# Patient Record
Sex: Female | Born: 1975 | Race: White | Hispanic: No | Marital: Married | Smoking: Never smoker
Health system: Southern US, Community
[De-identification: ages and names within clinical notes are randomized; demographics above are authoritative.]

## PROBLEM LIST (undated history)

## (undated) DIAGNOSIS — J45909 Unspecified asthma, uncomplicated: Secondary | ICD-10-CM

## (undated) DIAGNOSIS — Q796 Ehlers-Danlos syndrome, unspecified: Secondary | ICD-10-CM

## (undated) DIAGNOSIS — E7521 Fabry (-Anderson) disease: Secondary | ICD-10-CM

## (undated) DIAGNOSIS — K3184 Gastroparesis: Secondary | ICD-10-CM

## (undated) DIAGNOSIS — M797 Fibromyalgia: Secondary | ICD-10-CM

---

## 2018-08-23 ENCOUNTER — Encounter (HOSPITAL_COMMUNITY): Payer: Self-pay | Admitting: Emergency Medicine

## 2018-08-23 ENCOUNTER — Other Ambulatory Visit: Payer: Self-pay

## 2018-08-23 ENCOUNTER — Emergency Department (HOSPITAL_COMMUNITY): Payer: BLUE CROSS/BLUE SHIELD

## 2018-08-23 ENCOUNTER — Emergency Department (HOSPITAL_COMMUNITY)
Admission: EM | Admit: 2018-08-23 | Discharge: 2018-08-23 | Disposition: A | Discharge status: Home / Self Care | Payer: BLUE CROSS/BLUE SHIELD | Attending: Emergency Medicine | Admitting: Emergency Medicine

## 2018-08-23 DIAGNOSIS — R0602 Shortness of breath: Secondary | ICD-10-CM | POA: Diagnosis present

## 2018-08-23 DIAGNOSIS — J40 Bronchitis, not specified as acute or chronic: Secondary | ICD-10-CM | POA: Diagnosis not present

## 2018-08-23 DIAGNOSIS — J4541 Moderate persistent asthma with (acute) exacerbation: Secondary | ICD-10-CM | POA: Insufficient documentation

## 2018-08-23 HISTORY — DX: Gastroparesis: K31.84

## 2018-08-23 HISTORY — DX: Unspecified asthma, uncomplicated: J45.909

## 2018-08-23 HISTORY — DX: Ehlers-Danlos syndrome, unspecified: Q79.60

## 2018-08-23 HISTORY — DX: Fibromyalgia: M79.7

## 2018-08-23 HISTORY — DX: Fabry (-anderson) disease: E75.21

## 2018-08-23 LAB — CBC
HEMATOCRIT: 34 % — AB (ref 36.0–46.0)
HEMOGLOBIN: 11.3 g/dL — AB (ref 12.0–15.0)
MCH: 29.4 pg (ref 26.0–34.0)
MCHC: 33.2 g/dL (ref 30.0–36.0)
MCV: 88.5 fL (ref 78.0–100.0)
Platelets: 323 10*3/uL (ref 150–400)
RBC: 3.84 MIL/uL — AB (ref 3.87–5.11)
RDW: 14.6 % (ref 11.5–15.5)
WBC: 6.7 10*3/uL (ref 4.0–10.5)

## 2018-08-23 LAB — I-STAT BETA HCG BLOOD, ED (MC, WL, AP ONLY)

## 2018-08-23 LAB — BASIC METABOLIC PANEL
ANION GAP: 13 (ref 5–15)
BUN: 12 mg/dL (ref 6–20)
CHLORIDE: 108 mmol/L (ref 98–111)
CO2: 21 mmol/L — ABNORMAL LOW (ref 22–32)
Calcium: 9.6 mg/dL (ref 8.9–10.3)
Creatinine, Ser: 0.68 mg/dL (ref 0.44–1.00)
Glucose, Bld: 115 mg/dL — ABNORMAL HIGH (ref 70–99)
POTASSIUM: 3.8 mmol/L (ref 3.5–5.1)
SODIUM: 142 mmol/L (ref 135–145)

## 2018-08-23 LAB — I-STAT TROPONIN, ED: Troponin i, poc: 0 ng/mL (ref 0.00–0.08)

## 2018-08-23 MED ORDER — IPRATROPIUM-ALBUTEROL 0.5-2.5 (3) MG/3ML IN SOLN
3.0000 mL | Freq: Once | RESPIRATORY_TRACT | Status: AC
  Administered 2018-08-23: 3 mL via RESPIRATORY_TRACT
  Filled 2018-08-23: qty 3

## 2018-08-23 MED ORDER — AZITHROMYCIN 250 MG PO TABS: 250.0000 mg | ORAL_TABLET | Freq: Every day | ORAL | 0 refills | Status: AC

## 2018-08-23 MED ORDER — ALBUTEROL SULFATE (2.5 MG/3ML) 0.083% IN NEBU
5.0000 mg | INHALATION_SOLUTION | Freq: Once | RESPIRATORY_TRACT | Status: AC
  Administered 2018-08-23: 5 mg via RESPIRATORY_TRACT
  Filled 2018-08-23: qty 6

## 2018-08-23 MED ORDER — ALBUTEROL SULFATE HFA 108 (90 BASE) MCG/ACT IN AERS: 1.0000 | INHALATION_SPRAY | Freq: Four times a day (QID) | RESPIRATORY_TRACT | 0 refills | Status: AC | PRN

## 2018-08-23 MED ORDER — GI COCKTAIL ~~LOC~~
30.0000 mL | Freq: Once | ORAL | Status: AC
  Administered 2018-08-23: 30 mL via ORAL
  Filled 2018-08-23: qty 30

## 2018-08-23 MED ORDER — PREDNISONE 20 MG PO TABS
40.0000 mg | ORAL_TABLET | Freq: Once | ORAL | Status: AC
  Administered 2018-08-23: 40 mg via ORAL
  Filled 2018-08-23: qty 2

## 2018-08-23 MED ORDER — PREDNISONE 10 MG PO TABS: 40.0000 mg | ORAL_TABLET | Freq: Every day | ORAL | 0 refills | Status: AC

## 2018-08-23 MED ORDER — AZITHROMYCIN 250 MG PO TABS
500.0000 mg | ORAL_TABLET | Freq: Once | ORAL | Status: AC
  Administered 2018-08-23: 500 mg via ORAL
  Filled 2018-08-23: qty 2

## 2018-08-23 NOTE — Discharge Instructions (Signed)
We believe that your symptoms are caused today by an exacerbation of your asthma.  Please take the prescribed medications and any medications that you have at home.  Follow up with your doctor as recommended.  If you develop any new or worsening symptoms, including but not limited to fever, persistent vomiting, worsening shortness of breath, or other symptoms that concern you, please return to the Emergency Department immediately. ° ° °Asthma °Asthma is a recurring condition in which the airways tighten and narrow. Asthma can make it difficult to breathe. It can cause coughing, wheezing, and shortness of breath. Asthma episodes, also called asthma attacks, range from minor to life-threatening. Asthma cannot be cured, but medicines and lifestyle changes can help control it. °CAUSES °Asthma is believed to be caused by inherited (genetic) and environmental factors, but its exact cause is unknown. Asthma may be triggered by allergens, lung infections, or irritants in the air. Asthma triggers are different for each person. Common triggers include:  °Animal dander. °Dust mites. °Cockroaches. °Pollen from trees or grass. °Mold. °Smoke. °Air pollutants such as dust, household cleaners, hair sprays, aerosol sprays, paint fumes, strong chemicals, or strong odors. °Cold air, weather changes, and winds (which increase molds and pollens in the air). °Strong emotional expressions such as crying or laughing hard. °Stress. °Certain medicines (such as aspirin) or types of drugs (such as beta-blockers). °Sulfites in foods and drinks. Foods and drinks that may contain sulfites include dried fruit, potato chips, and sparkling grape juice. °Infections or inflammatory conditions such as the flu, a cold, or an inflammation of the nasal membranes (rhinitis). °Gastroesophageal reflux disease (GERD). °Exercise or strenuous activity. °SYMPTOMS °Symptoms may occur immediately after asthma is triggered or many hours later. Symptoms  include: °Wheezing. °Excessive nighttime or early morning coughing. °Frequent or severe coughing with a common cold. °Chest tightness. °Shortness of breath. °DIAGNOSIS  °The diagnosis of asthma is made by a review of your medical history and a physical exam. Tests may also be performed. These may include: °Lung function studies. These tests show how much air you breathe in and out. °Allergy tests. °Imaging tests such as X-rays. °TREATMENT  °Asthma cannot be cured, but it can usually be controlled. Treatment involves identifying and avoiding your asthma triggers. It also involves medicines. There are 2 classes of medicine used for asthma treatment:  °Controller medicines. These prevent asthma symptoms from occurring. They are usually taken every day. °Reliever or rescue medicines. These quickly relieve asthma symptoms. They are used as needed and provide short-term relief. °Your health care provider will help you create an asthma action plan. An asthma action plan is a written plan for managing and treating your asthma attacks. It includes a list of your asthma triggers and how they may be avoided. It also includes information on when medicines should be taken and when their dosage should be changed. An action plan may also involve the use of a device called a peak flow meter. A peak flow meter measures how well the lungs are working. It helps you monitor your condition. °HOME CARE INSTRUCTIONS  °Take medicines only as directed by your health care provider. Speak with your health care provider if you have questions about how or when to take the medicines. °Use a peak flow meter as directed by your health care provider. Record and keep track of readings. °Understand and use the action plan to help minimize or stop an asthma attack without needing to seek medical care. °Control your home environment in the following   ways to help prevent asthma attacks: °Do not smoke. Avoid being exposed to secondhand smoke. °Change  your heating and air conditioning filter regularly. °Limit your use of fireplaces and wood stoves. °Get rid of pests (such as roaches and mice) and their droppings. °Throw away plants if you see mold on them. °Clean your floors and dust regularly. Use unscented cleaning products. °Try to have someone else vacuum for you regularly. Stay out of rooms while they are being vacuumed and for a short while afterward. If you vacuum, use a dust mask from a hardware store, a double-layered or microfilter vacuum cleaner bag, or a vacuum cleaner with a HEPA filter. °Replace carpet with wood, tile, or vinyl flooring. Carpet can trap dander and dust. °Use allergy-proof pillows, mattress covers, and box spring covers. °Wash bed sheets and blankets every week in hot water and dry them in a dryer. °Use blankets that are made of polyester or cotton. °Clean bathrooms and kitchens with bleach. If possible, have someone repaint the walls in these rooms with mold-resistant paint. Keep out of the rooms that are being cleaned and painted. °Wash hands frequently. °SEEK MEDICAL CARE IF:  °You have wheezing, shortness of breath, or a cough even if taking medicine to prevent attacks. °The colored mucus you cough up (sputum) is thicker than usual. °Your sputum changes from clear or white to yellow, green, gray, or bloody. °You have any problems that may be related to the medicines you are taking (such as a rash, itching, swelling, or trouble breathing). °You are using a reliever medicine more than 2-3 times per week. °Your peak flow is still at 50-79% of your personal best after following your action plan for 1 hour. °You have a fever. °SEEK IMMEDIATE MEDICAL CARE IF:  °You seem to be getting worse and are unresponsive to treatment during an asthma attack. °You are short of breath even at rest. °You get short of breath when doing very little physical activity. °You have difficulty eating, drinking, or talking due to asthma symptoms. °You  develop chest pain. °You develop a fast heartbeat. °You have a bluish color to your lips or fingernails. °You are light-headed, dizzy, or faint. °Your peak flow is less than 50% of your personal best. °MAKE SURE YOU:  °Understand these instructions. °Will watch your condition. °Will get help right away if you are not doing well or get worse. °Document Released: 12/04/2005 Document Revised: 04/20/2014 Document Reviewed: 07/03/2013 °[employer]® Patient Information ©2015 [employer]. This information is not intended to replace advice given to you by your health care provider. Make sure you discuss any questions you have with your health care provider. ° °How to Use a Nebulizer °If you have asthma or other breathing problems, you might need to breathe in (inhale) medicine. This can be done with a nebulizer. A nebulizer is a device that turns liquid medicine into a mist that you can inhale.  °There are different kinds of nebulizers. Most are small. With some, you breathe in through a mouthpiece. With others, a mask fits over your nose and mouth. Most nebulizers must be connected to a small air compressor. Air is forced through tubing from the compressor to the nebulizer. The forced air changes the liquid into a fine spray. °RISKS AND COMPLICATIONS °The nebulizer must work properly for it to help your breathing. If the nebulizer does not produce mist, or if foam comes out, this indicates that the nebulizer is not working properly. Sometimes a filter can get clogged, or   there might be a problem with the air compressor. Check the instruction booklet that came with your nebulizer. It should tell you how to fix problems or where to call for help. You should have at least one extra nebulizer at home. That way, you will always have one when you need it.  °HOW TO PREPARE BEFORE USING THE NEBULIZER °Take these steps before using the nebulizer: °Check your medicine. Make sure it has not expired and is not damaged in any way.    °Wash your hands with soap and water.   °Put all the parts of your nebulizer on a sturdy, flat surface. Make sure the tubing connects the compressor and the nebulizer. °Measure the liquid medicine according to your health care provider's instructions. Pour it into the nebulizer. °Attach the mouthpiece or mask.   °Test the nebulizer by turning it on to make sure a spray is coming out. Then, turn it off.   °HOW TO USE THE NEBULIZER °Sit down and focus on staying relaxed.   °If your nebulizer has a mask, put it over your nose and mouth. If you use a mouthpiece, put it in your mouth. Press your lips firmly around the mouthpiece. °Turn on the nebulizer.   °Breathe out.   °Some nebulizers have a finger valve. If yours does, cover up the air hole so the air gets to the nebulizer. °Once the medicine begins to mist out, take slow, deep breaths. If there is a finger valve, release it at the end of your breath. °Continue taking slow, deep breaths until the nebulizer is empty.   °Be sure to stop the machine at any point if you start coughing or if the medicine foams or bubbles. °HOW TO CLEAN THE NEBULIZER  °The nebulizer and all its parts must be kept very clean. Follow the manufacturer's instructions for cleaning. For most nebulizers, you should follow these guidelines: °Wash the nebulizer after each use. Use warm water and soap. Rinse it well. Shake the nebulizer to remove extra water. Put it on a clean towel until it is completely dry. To make sure it is dry, put the nebulizer back together. Turn on the compressor for a few minutes. This will blow air through the nebulizer.   °Do not wash the tubing or the finger valve.   °Store the nebulizer in a dust-free place.   °Inspect the filter every week. Replace it any time it looks dirty.   °Sometimes the nebulizer will need a more complete cleaning. The instruction booklet should say how often you need to do this. °SEEK MEDICAL CARE IF:  °You continue to have difficulty  breathing.   °You have trouble using the nebulizer.   °Document Released: 11/22/2009 Document Revised: 04/20/2014 Document Reviewed: 05/26/2013 °[employer]® Patient Information ©2015 [employer]. This information is not intended to replace advice given to you by your health care provider. Make sure you discuss any questions you have with your health care provider. ° °How to Use an Inhaler °Proper inhaler technique is very important. Good technique ensures that the medicine reaches the lungs. Poor technique results in depositing the medicine on the tongue and back of the throat rather than in the airways. If you do not use the inhaler with good technique, the medicine will not help you. °STEPS TO FOLLOW IF USING AN INHALER WITHOUT AN EXTENSION TUBE °Remove the cap from the inhaler. °If you are using the inhaler for the first time, you will need to prime it. Shake the inhaler for   5 seconds and release four puffs into the air, away from your face. Ask your health care provider or pharmacist if you have questions about priming your inhaler. °Shake the inhaler for 5 seconds before each breath in (inhalation). °Position the inhaler so that the top of the canister faces up. °Put your index finger on the top of the medicine canister. Your thumb supports the bottom of the inhaler. °Open your mouth. °Either place the inhaler between your teeth and place your lips tightly around the mouthpiece, or hold the inhaler 1-2 inches away from your open mouth. If you are unsure of which technique to use, ask your health care provider. °Breathe out (exhale) normally and as completely as possible. °Press the canister down with your index finger to release the medicine. °At the same time as the canister is pressed, inhale deeply and slowly until your lungs are completely filled. This should take 4-6 seconds. Keep your tongue down. °Hold the medicine in your lungs for 5-10 seconds (10 seconds is best). This helps the medicine get into the  small airways of your lungs. °Breathe out slowly, through pursed lips. Whistling is an example of pursed lips. °Wait at least 15-30 seconds between puffs. Continue with the above steps until you have taken the number of puffs your health care provider has ordered. Do not use the inhaler more than your health care provider tells you. °Replace the cap on the inhaler. °Follow the directions from your health care provider or the inhaler insert for cleaning the inhaler. °STEPS TO FOLLOW IF USING AN INHALER WITH AN EXTENSION (SPACER) °Remove the cap from the inhaler. °If you are using the inhaler for the first time, you will need to prime it. Shake the inhaler for 5 seconds and release four puffs into the air, away from your face. Ask your health care provider or pharmacist if you have questions about priming your inhaler. °Shake the inhaler for 5 seconds before each breath in (inhalation). °Place the open end of the spacer onto the mouthpiece of the inhaler. °Position the inhaler so that the top of the canister faces up and the spacer mouthpiece faces you. °Put your index finger on the top of the medicine canister. Your thumb supports the bottom of the inhaler and the spacer. °Breathe out (exhale) normally and as completely as possible. °Immediately after exhaling, place the spacer between your teeth and into your mouth. Close your lips tightly around the spacer. °Press the canister down with your index finger to release the medicine. °At the same time as the canister is pressed, inhale deeply and slowly until your lungs are completely filled. This should take 4-6 seconds. Keep your tongue down and out of the way. °Hold the medicine in your lungs for 5-10 seconds (10 seconds is best). This helps the medicine get into the small airways of your lungs. Exhale. °Repeat inhaling deeply through the spacer mouthpiece. Again hold that breath for up to 10 seconds (10 seconds is best). Exhale slowly. If it is difficult to take  this second deep breath through the spacer, breathe normally several times through the spacer. Remove the spacer from your mouth. °Wait at least 15-30 seconds between puffs. Continue with the above steps until you have taken the number of puffs your health care provider has ordered. Do not use the inhaler more than your health care provider tells you. °Remove the spacer from the inhaler, and place the cap on the inhaler. °Follow the directions from your health care provider   or the inhaler insert for cleaning the inhaler and spacer. °If you are using different kinds of inhalers, use your quick relief medicine to open the airways 10-15 minutes before using a steroid if instructed to do so by your health care provider. If you are unsure which inhalers to use and the order of using them, ask your health care provider, nurse, or respiratory therapist. °If you are using a steroid inhaler, always rinse your mouth with water after your last puff, then gargle and spit out the water. Do not swallow the water. °AVOID: °Inhaling before or after starting the spray of medicine. It takes practice to coordinate your breathing with triggering the spray. °Inhaling through the nose (rather than the mouth) when triggering the spray. °HOW TO DETERMINE IF YOUR INHALER IS FULL OR NEARLY EMPTY °You cannot know when an inhaler is empty by shaking it. A few inhalers are now being made with dose counters. Ask your health care provider for a prescription that has a dose counter if you feel you need that extra help. If your inhaler does not have a counter, ask your health care provider to help you determine the date you need to refill your inhaler. Write the refill date on a calendar or your inhaler canister. Refill your inhaler 7-10 days before it runs out. Be sure to keep an adequate supply of medicine. This includes making sure it is not expired, and that you have a spare inhaler.  °SEEK MEDICAL CARE IF:  °Your symptoms are only partially  relieved with your inhaler. °You are having trouble using your inhaler. °You have some increase in phlegm. °SEEK IMMEDIATE MEDICAL CARE IF:  °You feel little or no relief with your inhalers. You are still wheezing and are feeling shortness of breath or tightness in your chest or both. °You have dizziness, headaches, or a fast heart rate. °You have chills, fever, or night sweats. °You have a noticeable increase in phlegm production, or there is blood in the phlegm. °MAKE SURE YOU:  °Understand these instructions. °Will watch your condition. °Will get help right away if you are not doing well or get worse. °Document Released: 12/01/2000 Document Revised: 09/24/2013 Document Reviewed: 07/03/2013 °[employer]® Patient Information ©2015 [employer]. This information is not intended to replace advice given to you by your health care provider. Make sure you discuss any questions you have with your health care provider. ° ° ° °

## 2018-08-23 NOTE — ED Notes (Signed)
Patient transported to X-ray 

## 2018-08-23 NOTE — ED Provider Notes (Signed)
Emergency Department Provider Note   I have reviewed the triage vital signs and the nursing notes.   HISTORY  Chief Complaint Shortness of Breath   HPI Angie Santos is a 42 y.o. female with PMH of asthma, Ehlers-Danlos, and Gastroparesis's to the emergency department for evaluation of shortness of breath, wheezing, chest tightness.  Symptoms have been worsening over the past 3 days.  She is been trying her inhalers at home with no lasting relief.  She has no history of DVT or PE.  She also has GERD symptoms from her gastroparesis and states that sometimes affects her breathing.  No fevers or chills.  No productive cough. No radiation of symptoms or modifying factors.    Past Medical History:  Diagnosis Date  . Asthma   . Ehlers-Danlos syndrome   . Fabry's disease (HCC)   . Fibromyalgia   . Gastroparesis     There are no active problems to display for this patient.   History reviewed. No pertinent surgical history.    Allergies Antihistamines, chlorpheniramine-type  No family history on file.  Social History Social History   Tobacco Use  . Smoking status: Never Smoker  . Smokeless tobacco: Never Used  Substance Use Topics  . Alcohol use: Yes  . Drug use: Never    Review of Systems  Constitutional: No fever/chills Eyes: No visual changes. ENT: No sore throat. Cardiovascular: Positive chest tightness.  Respiratory: Positive shortness of breath. Gastrointestinal: No abdominal pain.  No nausea, no vomiting.  No diarrhea.  No constipation. Genitourinary: Negative for dysuria. Musculoskeletal: Negative for back pain. Skin: Negative for rash. Neurological: Negative for headaches, focal weakness or numbness.  10-point ROS otherwise negative.  ____________________________________________   PHYSICAL EXAM:  VITAL SIGNS: ED Triage Vitals  Enc Vitals Group     BP 08/23/18 1601 118/71     Pulse Rate 08/23/18 1601 (!) 108     Resp 08/23/18 1601 (!) 22   Temp 08/23/18 1601 98.2 F (36.8 C)     Temp Source 08/23/18 1601 Oral     SpO2 08/23/18 1601 100 %     Weight 08/23/18 1603 140 lb (63.5 kg)     Height 08/23/18 1603 5\' 4"  (1.626 m)     Pain Score 08/23/18 1603 6   Constitutional: Alert and oriented. Well appearing and in no acute distress. Eyes: Conjunctivae are normal. Head: Atraumatic. Nose: No congestion/rhinnorhea. Mouth/Throat: Mucous membranes are moist.  Oropharynx non-erythematous. Neck: No stridor.  Cardiovascular: Tachycardia. Good peripheral circulation. Grossly normal heart sounds.   Respiratory: Normal respiratory effort.  No retractions. Lungs with mild bilateral end-expiratory wheezing.  Gastrointestinal: Soft and nontender. No distention.  Musculoskeletal: No lower extremity tenderness nor edema. No gross deformities of extremities. Neurologic:  Normal speech and language. No gross focal neurologic deficits are appreciated.  Skin:  Skin is warm, dry and intact. No rash noted.  ____________________________________________   LABS (all labs ordered are listed, but only abnormal results are displayed)  Labs Reviewed  BASIC METABOLIC PANEL - Abnormal; Notable for the following components:      Result Value   CO2 21 (*)    Glucose, Bld 115 (*)    All other components within normal limits  CBC - Abnormal; Notable for the following components:   RBC 3.84 (*)    Hemoglobin 11.3 (*)    HCT 34.0 (*)    All other components within normal limits  I-STAT TROPONIN, ED  I-STAT BETA HCG BLOOD, ED ([facility], [facility], [facility]  ONLY)   ____________________________________________  EKG   EKG Interpretation  Date/Time:  Friday August 23 2018 15:59:56 EDT Ventricular Rate:  98 PR Interval:  142 QRS Duration: 134 QT Interval:  396 QTC Calculation: 505 R Axis:   92 Text Interpretation:  Normal sinus rhythm Right bundle branch block Abnormal ECG No STEMI.  Confirmed by Alona Bene [phone]) on 08/23/2018 10:18:48 PM        ____________________________________________  RADIOLOGY  Dg Chest 2 View  Result Date: 08/23/2018 CLINICAL DATA:  Shortness of breath for the past 3-4 days, worse at night. EXAM: CHEST - 2 VIEW COMPARISON:  None. FINDINGS: The heart size and mediastinal contours are within normal limits. Both lungs are clear. The visualized skeletal structures are unremarkable. IMPRESSION: Normal examination. Electronically Signed   By: Beckie Salts M.D.   On: 08/23/2018 16:54    ____________________________________________   PROCEDURES  Procedure(s) performed:   Procedures  None ____________________________________________   INITIAL IMPRESSION / ASSESSMENT AND PLAN / ED COURSE  Pertinent labs & imaging results that were available during my care of the patient were reviewed by me and considered in my medical decision making (see chart for details).  Patient presents to the emergency department for evaluation of shortness of breath with wheezing, coughing.  States this feels similar to prior bronchitis that she is had.  She is been trying albuterol at home with minimal relief.  Patient has mild wheezing on exam.  She did receive albuterol nebulizer and steroid from triage. Afebrile here. Labs with no acute findings.   CXR negative. Plan for treatment for likely bronchitis. Patient had CT angio in [place] which I reviewed in Care Everywhere with similar symptoms. Will not repeat imaging with last scan on 08/01/18.   At this time, I do not feel there is any life-threatening condition present. I have reviewed and discussed all results (EKG, imaging, lab, urine as appropriate), exam findings with patient. I have reviewed nursing notes and appropriate previous records.  I feel the patient is safe to be discharged home without further emergent workup. Discussed usual and customary return precautions. Patient and family (if present) verbalize understanding and are comfortable with this plan.  Patient  will follow-up with their primary care provider. If they do not have a primary care provider, information for follow-up has been provided to them. All questions have been answered.  ____________________________________________  FINAL CLINICAL IMPRESSION(S) / ED DIAGNOSES  Final diagnoses:  Moderate persistent asthma with exacerbation  Bronchitis     MEDICATIONS GIVEN DURING THIS VISIT:  Medications  albuterol (PROVENTIL) (2.5 MG/3ML) 0.083% nebulizer solution 5 mg (5 mg Nebulization Given 08/23/18 1611)  predniSONE (DELTASONE) tablet 40 mg (40 mg Oral Given 08/23/18 1634)  ipratropium-albuterol (DUONEB) 0.5-2.5 (3) MG/3ML nebulizer solution 3 mL (3 mLs Nebulization Given 08/23/18 2211)  gi cocktail (Maalox,Lidocaine,Donnatal) (30 mLs Oral Given 08/23/18 2211)  azithromycin (ZITHROMAX) tablet 500 mg (500 mg Oral Given 08/23/18 2247)     NEW OUTPATIENT MEDICATIONS STARTED DURING THIS VISIT:  Discharge Medication List as of 08/23/2018 10:42 PM    START taking these medications   Details  albuterol (PROVENTIL HFA;VENTOLIN HFA) 108 (90 Base) MCG/ACT inhaler Inhale 1-2 puffs into the lungs every 6 (six) hours as needed for wheezing or shortness of breath., Starting Fri 08/23/2018, Print    azithromycin (ZITHROMAX) 250 MG tablet Take 1 tablet (250 mg total) by mouth daily. Take first 2 tablets together, then 1 every day until finished., Starting Fri 08/23/2018, Print  predniSONE (DELTASONE) 10 MG tablet Take 4 tablets (40 mg total) by mouth daily for 4 days., Starting Sat 08/24/2018, Until Wed 08/28/2018, Print        Note:  This document was prepared using Dragon voice recognition software and may include unintentional dictation errors.  Alona Bene, MD Emergency Medicine    Long, Arlyss Repress, MD 08/24/18 [phone]

## 2018-08-23 NOTE — ED Provider Notes (Signed)
Patient placed in Quick Look pathway, seen and evaluated   Chief Complaint: Shortness of breath for 3 days  HPI:   With a history of asthma has been using her rescue and maintenance inhalers continuously without significant relief.  This exacerbation is been going on for about 3 days.  No fevers.   ROS: Shortness of breath (one)  Physical Exam:   Gen: No distress  Neuro: Awake and Alert  Skin: Warm    Focused Exam: diffuse wheezing bilaterally.    Initiation of care has begun. The patient has been counseled on the process, plan, and necessity for staying for the completion/evaluation, and the remainder of the medical screening examination    Norman Clay 08/23/18 1619    Margarita Grizzle, MD 08/26/18 2115

## 2018-08-23 NOTE — ED Triage Notes (Signed)
Patient reports worsening shortness of breath x 3 days. Been using rescue and maintenance inhalers continuously, started using every 4 hours not having to do almost every hour. Pt breathing labored able to speak in full sentences.

## 2018-08-23 NOTE — ED Notes (Signed)
Updated on wait for treatment room. 

## 2018-08-23 NOTE — ED Notes (Signed)
ED Provider at bedside. 

## 2018-08-23 NOTE — ED Notes (Signed)
Patient left at this time with all belongings. 

## 2019-05-25 IMAGING — DX DG CHEST 2V
2 series · 2 of 2 positions shown · non-contrast
Comparison: None.

CLINICAL DATA: Shortness of breath for the past 3-4 days, worse at
night.

EXAM:
CHEST - 2 VIEW

[chest pa]
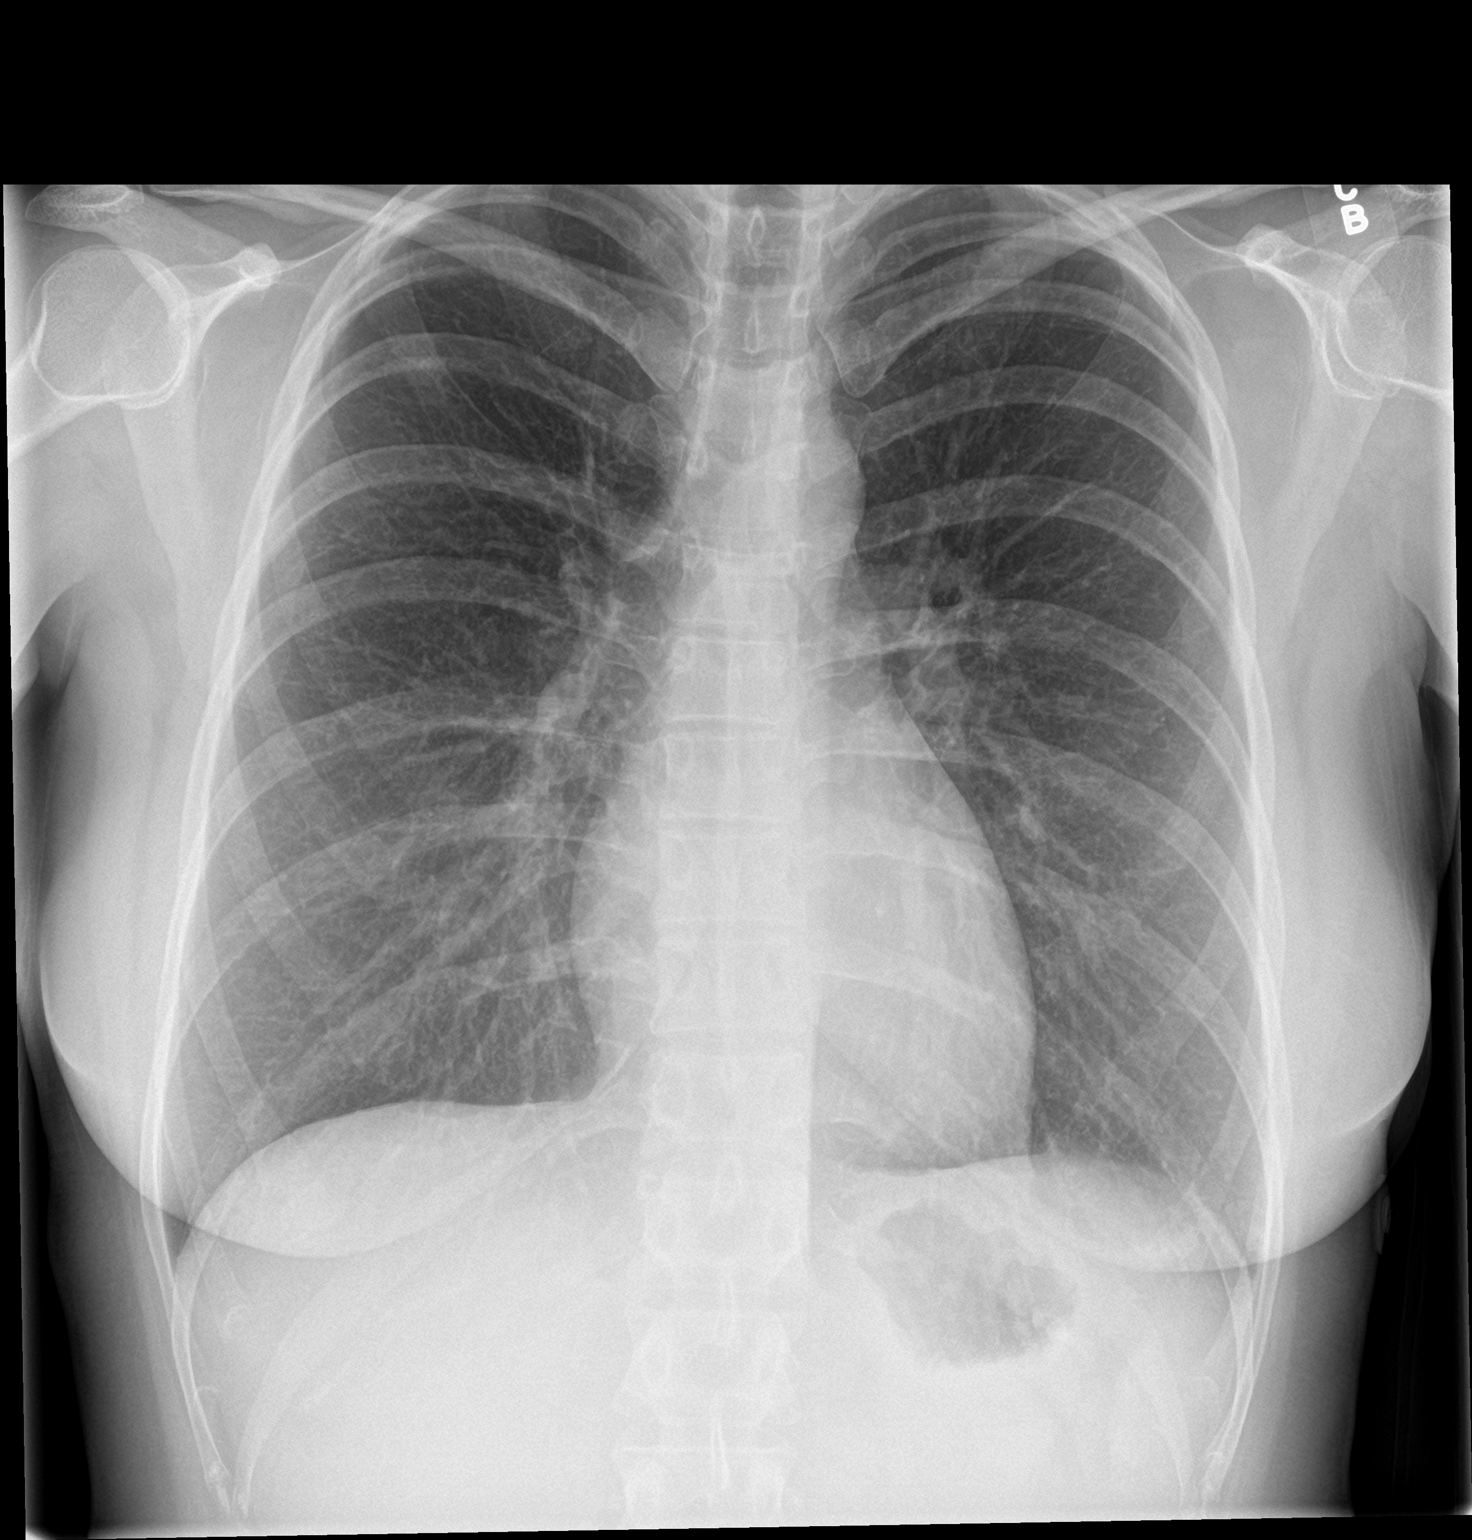

[chest lat]
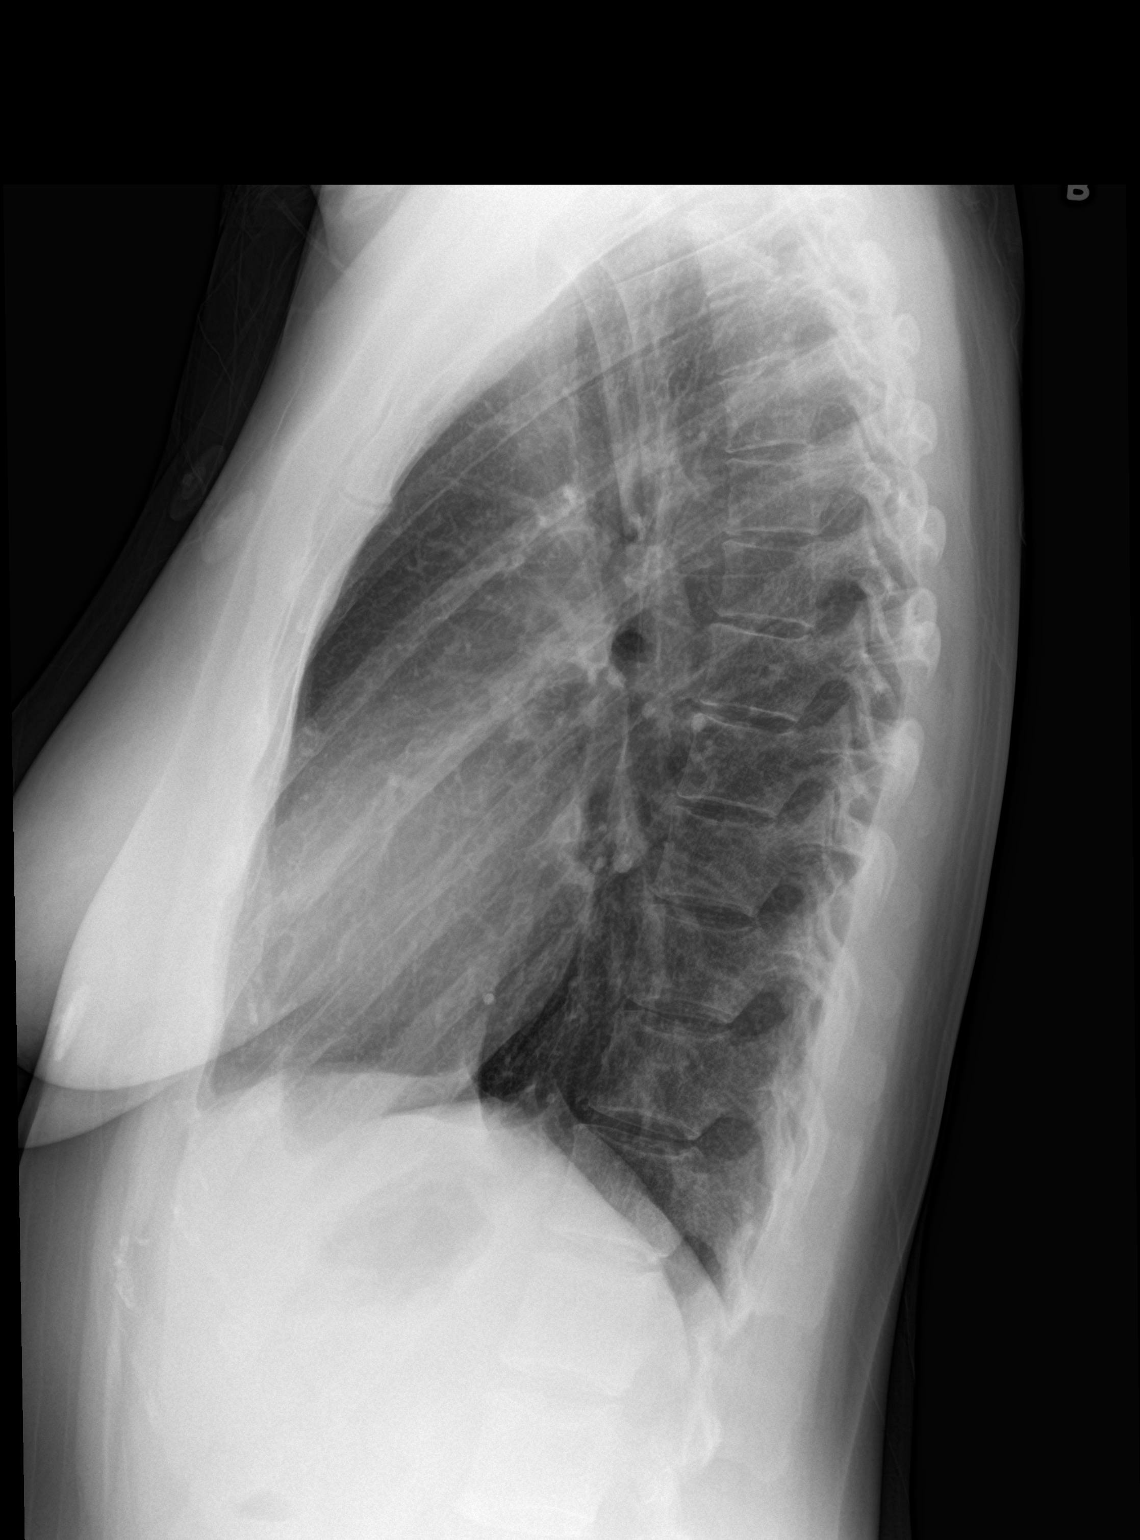

[2 of 2 positions shown; findings below may reference images not displayed]

FINDINGS: The heart size and mediastinal contours are within normal limits.
Both lungs are clear. The visualized skeletal structures are
unremarkable.
IMPRESSION: Normal examination.

## 2020-12-31 ENCOUNTER — Telehealth

## 2020-12-31 NOTE — Telephone Encounter
Call Back Request      Reason for call back: Danya calling to speak with Dr. Lynnae Sandhoff nurse. 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     CBN [phone]    Any Symptoms:  []  Yes  [x]  No       If yes, what symptoms are you experiencing:    o Duration of symptoms (how long):    o Have you taken medication for symptoms (OTC or Rx):      Patient or caller has been notified of the 24-48 hour turnaround time.

## 2021-01-13 ENCOUNTER — Telehealth: Payer: BLUE CROSS/BLUE SHIELD

## 2021-01-13 NOTE — Telephone Encounter
PDL Call to Practice    Reason for Call: Patient is returning missed call to NP Marda Stalker. 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 PDL line was called and not answered. Please advise, thank you.     Appointment Related?  []  Yes  [x]  No     If yes;  Date:  Time:    Call warm transferred to PDL: []  Yes  [x]  No    Call Received by Practice Representative: no answer

## 2021-01-13 NOTE — Telephone Encounter
Reply by: Vania Rea. [facility] patient. No answer. Left VM.

## 2021-01-13 NOTE — Telephone Encounter
Spoke with patient. I reached out to administrative staff to assist patient with scheduling a video appointment with Dr. Romie Levee.

## 2021-01-13 NOTE — Telephone Encounter
Good morning,     This is a confirmation of your appointment on Wednesday 01/26/21 at 1:00pm via video visit. I have attached the new patient questionnaire, please fill out and email back prior to the appointment.    A separate email was sent asking you to sign up for mychart.          VIDEO VISIT - NEW  Please join by at least 5 minutes beforehand. Your provider will make every effort to join on time but please stay connected if they are not on right at your scheduled time. If they have not joined within 10 minutes of the appointment, please feel free to call their office for an update.      If using a computer to connect, please do NOT use Forensic scientist.   (Chrome, Firefox, [place], and [place] are all supported)     Also, please note that many usual ''face-to-face'' services can be provided via Immunologist. However, preventive services such as well child checks and annual exams cannot be billed via Telehealth. Medicare does permit Medicare Wellness Visits to be performed via Video technology and as such would be covered as preventative services. Please note: you may be liable for any relevant copays or coinsurance depending on your insurance plan.     For technical issues, please call [phone].       We need to make sure that records associated with diagnosis are sent to [place], this can be done by bringing on the day of consultation or [facility] Department of Neurosurgery [email or web address], Faxing to [phone] or  mailing everything together meaning reports and imaging. These records would consist of reports, pain management, or any provider notes that have been currently treating you for this medical issue.  Below you will find various ways to have imaging( MRI/ CT) sent over to [place]:   You may use the link below to upload your images into directly into your electronic chart. However, you will need to notify the office so we may confirm receipt of the successful download.  ??? [email or web address]   You may also take disks to one of our conveniently located [facility] radiology facilities to have the images loaded on your behalf. Please see link below for a location near you.  ??? [email or web address]   You may mail it to [place] or have imaging facility mail it to [place]:   Attention: Dr. Bernadene Bell  Mailing address: [address], [address]  [place], [place] [address].    Chelsie Burel C. Malcolm Hetz  Administrative Assistant lll to Teressa Senter, M.D., Bernadene Bell, M.D.  [facility] Department of Neurosurgery Spine  [address]   [place], [address]  Tel: [phone]  Fax: [phone]   [email or web address]   [email or web address]

## 2021-01-25 ENCOUNTER — Telehealth: Payer: BLUE CROSS/BLUE SHIELD

## 2021-01-25 NOTE — Telephone Encounter
PDL Call to Practice    Reason for Call: Patient called to confirm Imaging was received for tomorrows video visit with Dr. Romie Levee. Per patient she had imaging mailed to the office yesterday. PDL ,was called and spoke with Francena Hanly who advised she would send a message to [facility] and patient would be contacted to confirm.    Appointment Related?  [x]  Yes  []  No     If yes;  Date:  Time:    Call warm transferred to PDL: []  Yes  [x]  No    Call Received by Practice Representative:Stella

## 2021-01-25 NOTE — Telephone Encounter
On phone with pt now, we have pushed the appointment to Friday. She said imaging should be here by tomorrow but was just unsure of the time the imaging would get to [place].

## 2021-01-26 ENCOUNTER — Ambulatory Visit: Payer: BLUE CROSS/BLUE SHIELD

## 2021-01-28 ENCOUNTER — Inpatient Hospital Stay: Payer: BLUE CROSS/BLUE SHIELD

## 2021-01-28 ENCOUNTER — Telehealth: Payer: BLUE CROSS/BLUE SHIELD

## 2021-01-28 DIAGNOSIS — G96191 Tarlov cysts: Secondary | ICD-10-CM

## 2021-01-28 DIAGNOSIS — M5416 Radiculopathy, lumbar region: Secondary | ICD-10-CM

## 2021-01-28 DIAGNOSIS — R6889 Other general symptoms and signs: Secondary | ICD-10-CM

## 2021-01-28 NOTE — H&P
[facility]  TELEMEDICINE HISTORY & PHYSICAL    Patient Consent to Telehealth Questionnaire   April Carpenter TELEHEALTH PRECHECKIN QUESTIONS 01/19/2021   By clicking ''I Agree'', I consent to the below:  I Agree     - I agree  to be treated via a video visit and acknowledge that I may be liable for any relevant copays or coinsurance depending on my insurance plan.  - I understand that this video visit is offered for my convenience and I am able to cancel and reschedule for an in-person appointment if I desire.  - I also acknowledge that sensitive medical information may be discussed during this video visit appointment and that it is my responsibility to locate myself in a location that ensures privacy to my own level of comfort.  - I also acknowledge that I should not be participating in a video visit in a way that could cause danger to myself or to those around me (such as driving or walking).  If my provider is concerned about my safety, I understand that they have the right to terminate the visit.     DATE OF SERVICE  01/28/2021   ATTENDING NEUROSURGEON  Dr. Bernadene Bell, MD     HISTORY OF PRESENT ILLNESS   This is a 45 y.o.-year-old female with a history of bladder sling, who presents to the clinic for evaluation of L>R numbness from her hips to her feet, along with pain in her sacrum and low back.  The patient notes she experienced an episode in October 2021 where she lost feeling in her legs, along with other neurological symptoms. 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   April Carpenter primarily describes pain in her sacrum and low back. She also notes she was diagnosed with neurogenic bladder, and she reports difficulty with being able to feel when she is finished. The patient also endorses difficulties with bowel urgency, and notes she is unable to hold a movement once she feels she needs to go. Today while sitting, the patient notes she has numbness from her left hip down to her left foot.  In regards to conservative management, the patient has tried Norco and heat/cold compresses, without significant improvement. Finally, April Carpenter is able to complete daily tasks without assistance. She denies any other neurological symptoms.  Of note, the patient consulted with 2 other neurosurgeons who recommended surgical intervention for drainage of the demonstrated cysts. The patient is currently undergoing evaluation with her urologist, and is pending urodynamic studies.    PAST MEDICAL HISTORY  No past medical history on file.   PAST SURGICAL HISTORY  No past surgical history on file.   SOCIAL HISTORY   has no history on file for tobacco use, alcohol use, and drug use.   FAMILY HISTORY  No family history on file.   CURRENT MEDICATIONS  No outpatient medications have been marked as taking for the 01/28/21 encounter (Telemedicine) with April Reas., MD.      ALLERGIES  Patient has no allergy information on record.   REVIEW OF SYSTEMS  Fourteen systems are reviewed and noted in the patient's chart.     PHYSICAL EXAMINATION  No vitals taken during this visit, Telemedicine encounter. All within appearance of video frame:  GENERAL: Well appearing, no acute distress  PSYCH: Alert and oriented to person, place and time  HEENT: Normocephalic, normal sclera,  neck supple  CARDIOVASCULAR: regular rate, extremities appropriate color for ethnicity and seemingly well perfused. No peripheral edema or digital cyanosis.  RESPIRATORY: No labored breathing  GASTROINTESTINAL: Abdomen benign, soft, nondistended  RANGE OF MOTION: Symmetric and within normal limits.  SKIN: intact, no rashes or lesions    DIAGNOSTIC DATA  MR Lumbar:      ASSESSMENT  This is a 45 y.o.-year-old female  who presents to the clinic for evaluation of L>R numbness from her hips to her feet, along with pain in her sacrum and low back. 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  Per discussion with the patient, I advised the patient to forward her imaging to our clinic for further evaluation. Additionally, I explained to the patient that treatment of Tarlov Cysts are outside the scope of my current practice, and therefore I recommend the patient pursue another opinion.   The patient expressed an understanding and agreement to the plan. We encouraged the patient to contact our clinic in the interim with any questions or concerns. All questions were answered.   PLAN & FOLLOW UP   - Patient will forward her imaging to our clinic in the interim.  - We encouraged the patient to seek a further opinion, given the treatment of Tarlov Cysts are outside the scope of my current practice.    60  minutes were spent personally by me today on this encounter which may include today???s pre-visit review of the chart, time spent during the visit, and today???s time spent after the visit documenting and coordinating care.   The patient was seen, examined, and plan formulated with Dr. Bernadene Carpenter.     SCRIBE SIGNATURE(S):  I, April Carpenter, have assisted April Reas., MD with the documentation for April Carpenter on   01/28/2021 at 7:58 AM.    PHYSICIAN SIGNATURE(S):  April Reas., MD  01/28/2021 7:58 AM    I have reviewed this note, written by April Carpenter and attest that it is an accurate representation of my H & P and other events of the outpatient visit except if otherwise noted.

## 2021-01-28 NOTE — Telephone Encounter
PDL Call to Practice    Reason for Call:Pt called in about about vv appt she has today with Dr.Beckett.Pt is wanting to know what's going on as her appt was at 9:30am and he still hasn't seen her.I called PDL line and no answer.I escalated call to [employer].Advised pt to stay logged into vv.Pt aware and will await call from clinic to check status on MD.Thank you.    Appointment Related?  [x]  Yes  []  No     If yes;  Date: 02/11  Time:9:30    Call warm transferred to PDL: []  Yes  [x]  No    Call Received by Practice Representative:no answer.I escalated call to Supervisor 

## 2021-02-02 ENCOUNTER — Telehealth: Payer: BLUE CROSS/BLUE SHIELD

## 2021-02-02 NOTE — Telephone Encounter
Lm.am to offer return consultation as imaging from [facility] is now uploaded.

## 2021-02-14 ENCOUNTER — Telehealth: Payer: BLUE CROSS/BLUE SHIELD

## 2021-02-14 NOTE — Telephone Encounter
Spoke with pt regarding imaging, she stated there were actually CD's mailed to office.     I sent email to front desk to inquire, there are still images of MRI that was uploaded on 2/11 but pt stated there was an actual CD mailed.    I also informed her that the appt would need to be rescheduled due to authorization pending.

## 2021-02-16 ENCOUNTER — Telehealth: Payer: BLUE CROSS/BLUE SHIELD

## 2021-02-21 ENCOUNTER — Inpatient Hospital Stay: Payer: BLUE CROSS/BLUE SHIELD

## 2021-02-21 DIAGNOSIS — Z719 Counseling, unspecified: Secondary | ICD-10-CM

## 2021-02-21 DIAGNOSIS — G96191 Tarlov cyst: Secondary | ICD-10-CM

## 2021-02-21 NOTE — Progress Notes
Canceled.

## 2021-02-23 ENCOUNTER — Telehealth: Payer: BLUE CROSS/BLUE SHIELD

## 2021-03-21 NOTE — Progress Notes
Canceled.
